# Patient Record
Sex: Male | Born: 1990 | Race: White | Hispanic: No | Marital: Married | Smoking: Never smoker
Health system: Southern US, Community
[De-identification: ages and names within clinical notes are randomized; demographics above are authoritative.]

## PROBLEM LIST (undated history)

## (undated) DIAGNOSIS — N289 Disorder of kidney and ureter, unspecified: Secondary | ICD-10-CM

## (undated) HISTORY — PX: LITHOTRIPSY: SUR834

---

## 2012-06-02 ENCOUNTER — Emergency Department: Payer: Self-pay | Admitting: Emergency Medicine

## 2013-10-03 ENCOUNTER — Emergency Department: Payer: Self-pay | Admitting: Internal Medicine

## 2013-10-03 LAB — CBC
HCT: 41.3 % (ref 40.0–52.0)
HGB: 14.2 g/dL (ref 13.0–18.0)
MCH: 30.1 pg (ref 26.0–34.0)
MCHC: 34.5 g/dL (ref 32.0–36.0)
MCV: 87 fL (ref 80–100)
Platelet: 364 10*3/uL (ref 150–440)
RBC: 4.73 10*6/uL (ref 4.40–5.90)
RDW: 13.2 % (ref 11.5–14.5)
WBC: 11.7 10*3/uL — ABNORMAL HIGH (ref 3.8–10.6)

## 2013-10-03 LAB — COMPREHENSIVE METABOLIC PANEL
ALT: 44 U/L (ref 12–78)
ANION GAP: 5 — AB (ref 7–16)
AST: 34 U/L (ref 15–37)
Albumin: 4.1 g/dL (ref 3.4–5.0)
Alkaline Phosphatase: 57 U/L
BUN: 15 mg/dL (ref 7–18)
Bilirubin,Total: 0.6 mg/dL (ref 0.2–1.0)
CO2: 25 mmol/L (ref 21–32)
Calcium, Total: 9.7 mg/dL (ref 8.5–10.1)
Chloride: 107 mmol/L (ref 98–107)
Creatinine: 1.08 mg/dL (ref 0.60–1.30)
EGFR (African American): >60
EGFR (Non-African Amer.): >60
GLUCOSE: 102 mg/dL — AB (ref 65–99)
Osmolality: 275 (ref 275–301)
Potassium: 3.9 mmol/L (ref 3.5–5.1)
Sodium: 137 mmol/L (ref 136–145)
TOTAL PROTEIN: 8.3 g/dL — AB (ref 6.4–8.2)

## 2013-10-03 LAB — URINALYSIS, COMPLETE
BACTERIA: NONE SEEN
Bilirubin,UR: NEGATIVE
Glucose,UR: NEGATIVE mg/dL (ref 0–75)
Ketone: NEGATIVE
Leukocyte Esterase: NEGATIVE
Nitrite: NEGATIVE
Ph: 5 (ref 4.5–8.0)
RBC,UR: 30 /HPF (ref 0–5)
Specific Gravity: 1.031 (ref 1.003–1.030)
Squamous Epithelial: <1

## 2013-10-25 ENCOUNTER — Ambulatory Visit: Payer: Self-pay | Admitting: Urology

## 2013-11-15 ENCOUNTER — Ambulatory Visit: Payer: Self-pay | Admitting: Urology

## 2013-11-30 ENCOUNTER — Ambulatory Visit: Payer: Self-pay | Admitting: Urology

## 2014-04-10 ENCOUNTER — Ambulatory Visit: Payer: Self-pay | Admitting: Physician Assistant

## 2014-11-04 ENCOUNTER — Ambulatory Visit: Payer: Self-pay | Admitting: Physician Assistant

## 2014-12-18 ENCOUNTER — Ambulatory Visit
Admit: 2014-12-18 | Disposition: A | Discharge status: Home / Self Care | Payer: Self-pay | Attending: Family Medicine | Admitting: Family Medicine

## 2015-07-31 IMAGING — CR DG ABDOMEN 1V
1 series · 2 of 2 positions shown · non-contrast
Comparison: CT STONE STUDY dated 10/03/2013

CLINICAL DATA: recent hx left ureteral stone, no prev surg

EXAM:
ABDOMEN - 1 VIEW

[Series 1: supine kub · 0.17mm/px · 2 of 2 slices shown]
[im 1/2]
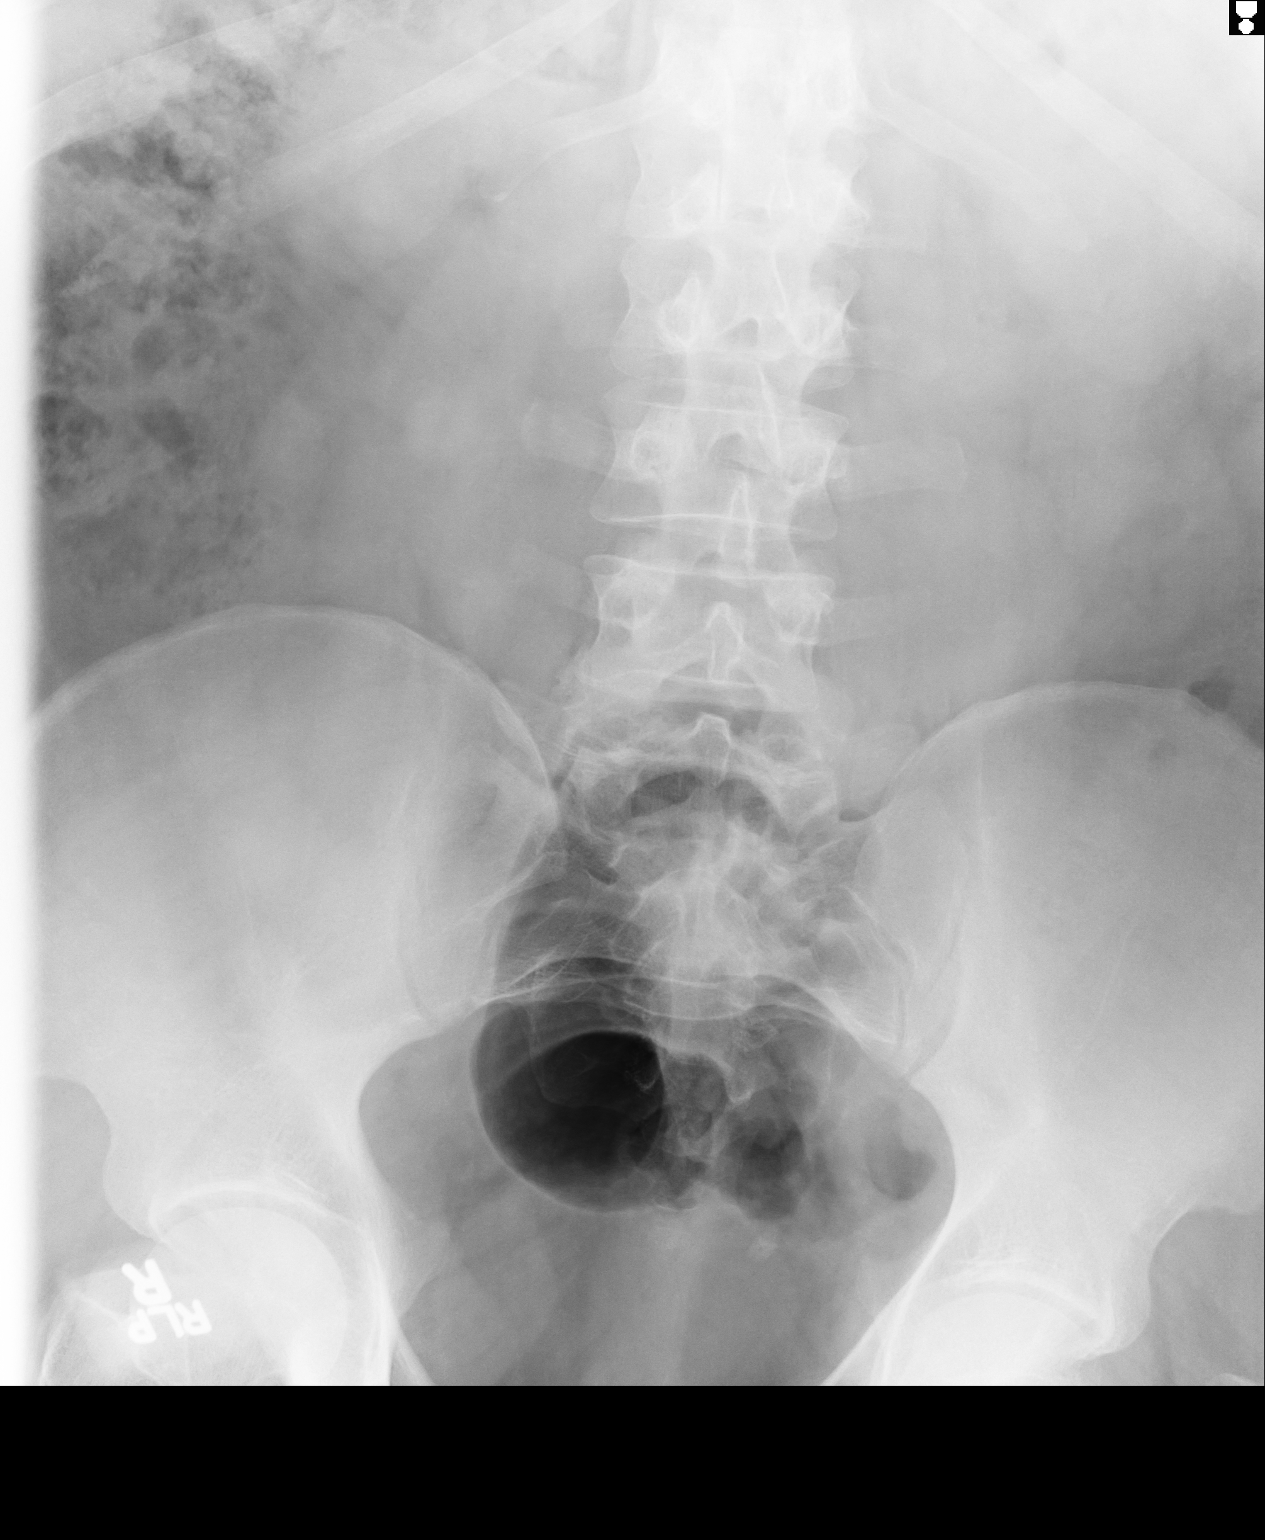
[im 2/2]
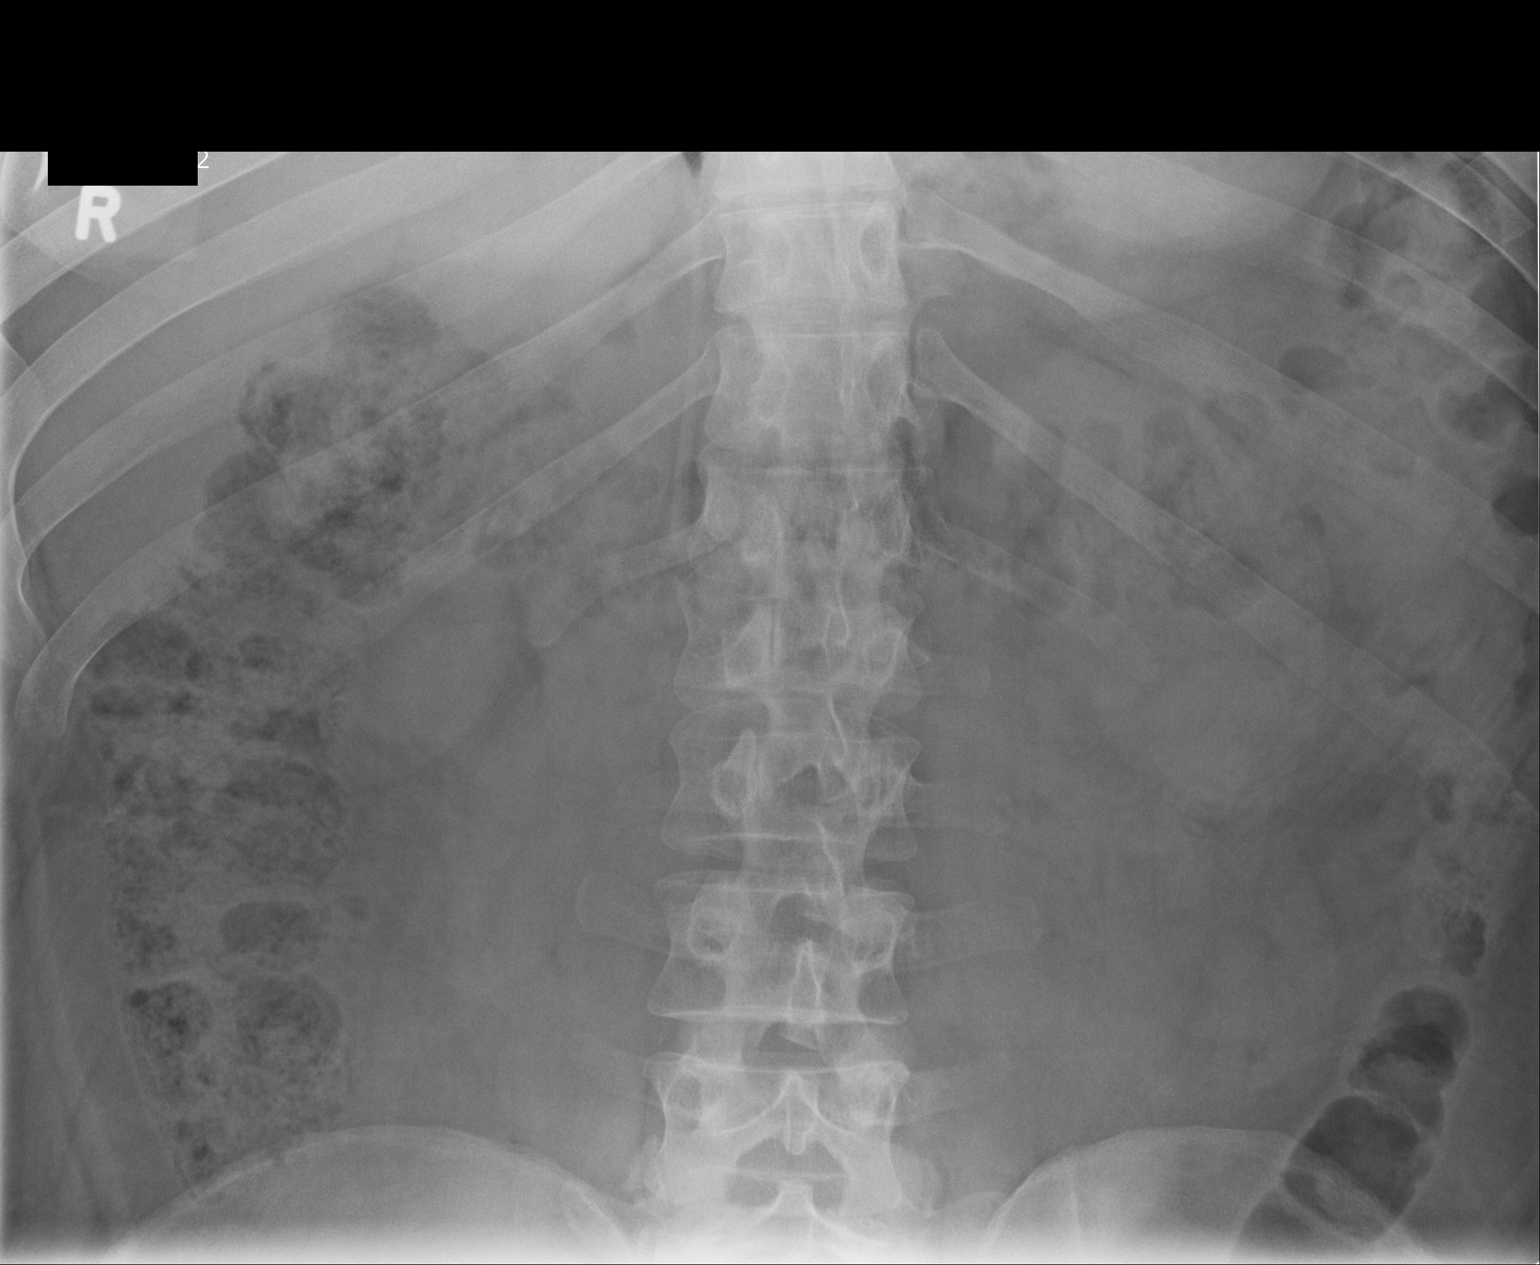

[2 of 2 positions shown; findings below may reference images not displayed]

FINDINGS: The bowel gas pattern is normal. Calcified 7.8 mm density projects
within the central left lateral portion of the pelvis just above the
dome of the urinary bladder. This finding correlates with the
previous CT findings and likely reflects the patient's distal left
ureteral calculus. No further evidence of calculus disease is
appreciated. A moderate to large amount of stool appreciated within
the colon. Osseous structures unremarkable.
IMPRESSION: Findings which appear to reflect the patient's distal left ureteral
calculus. Nonobstructive bowel gas pattern with a moderate to large
amount of stool.

## 2015-08-21 IMAGING — CR DG ABDOMEN 1V
1 series · 2 of 2 positions shown · non-contrast
Comparison: 10/04/2011, 10/25/2013

CLINICAL DATA: Left distal ureteral calculus

EXAM:
ABDOMEN - 1 VIEW

[Series 1: t abdomen supine · 0.14mm/px · 2 of 2 slices shown]
[im 1/2]
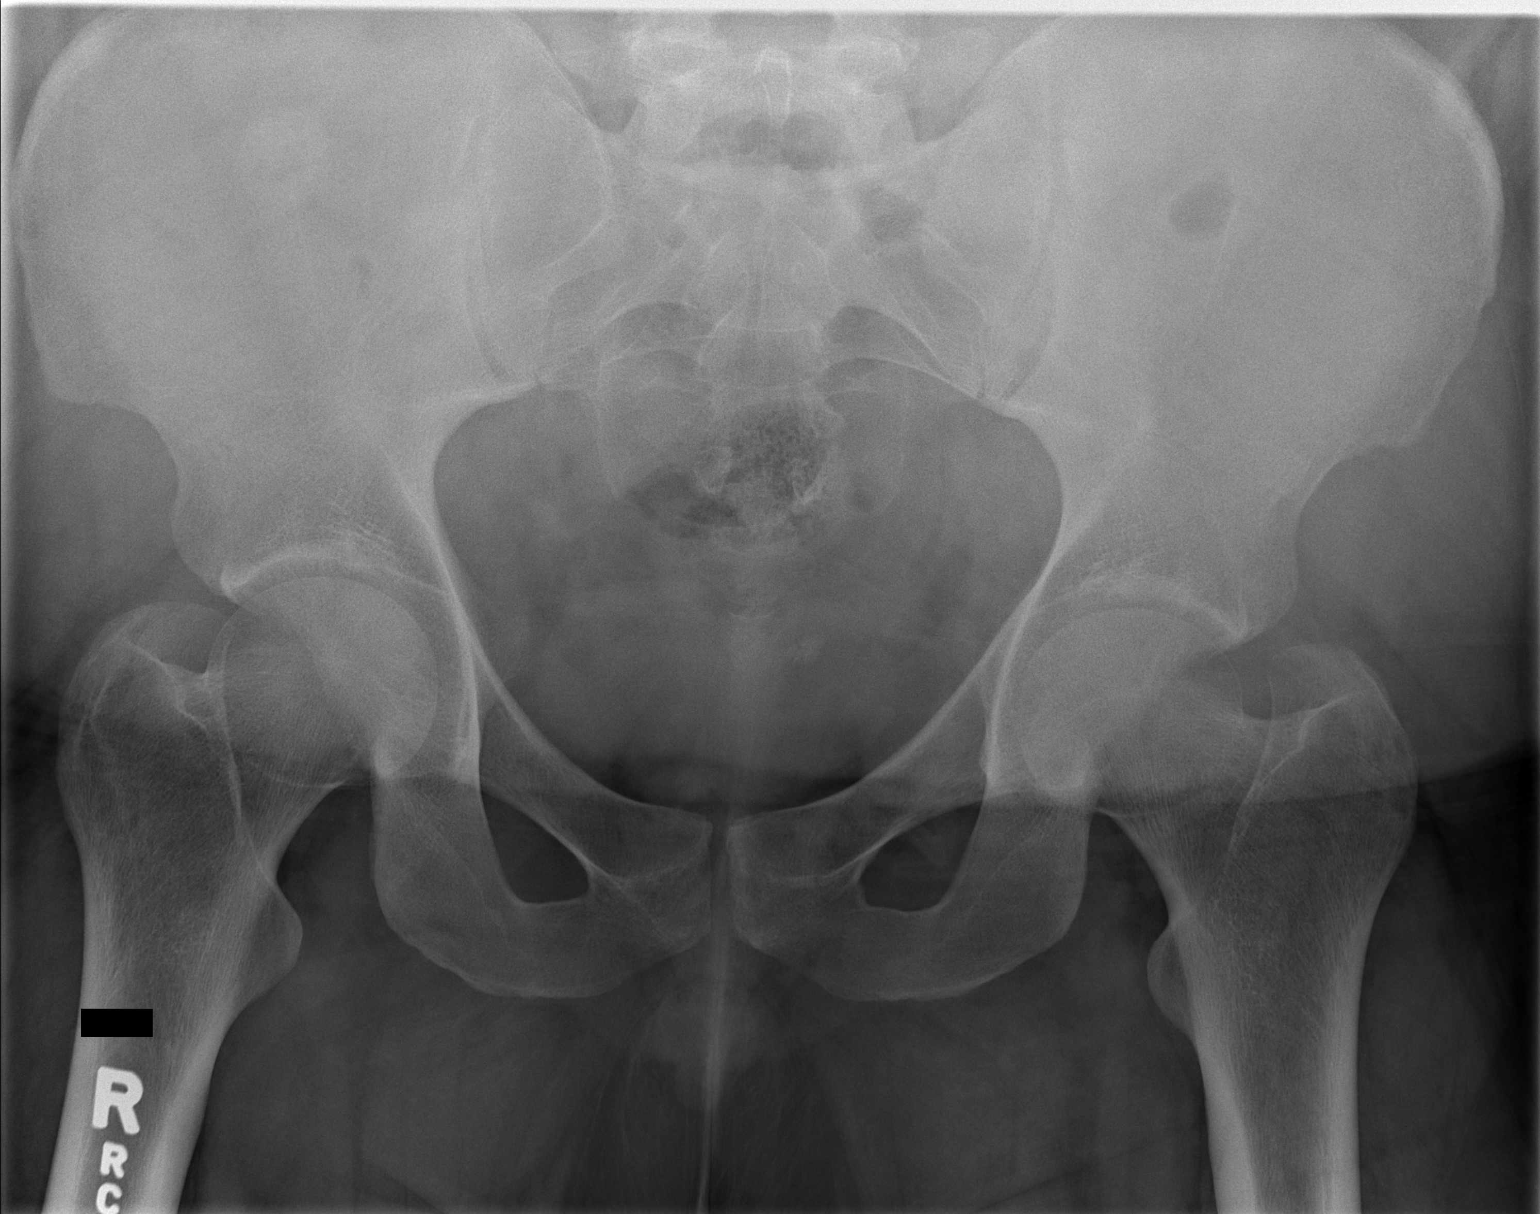
[im 2/2]
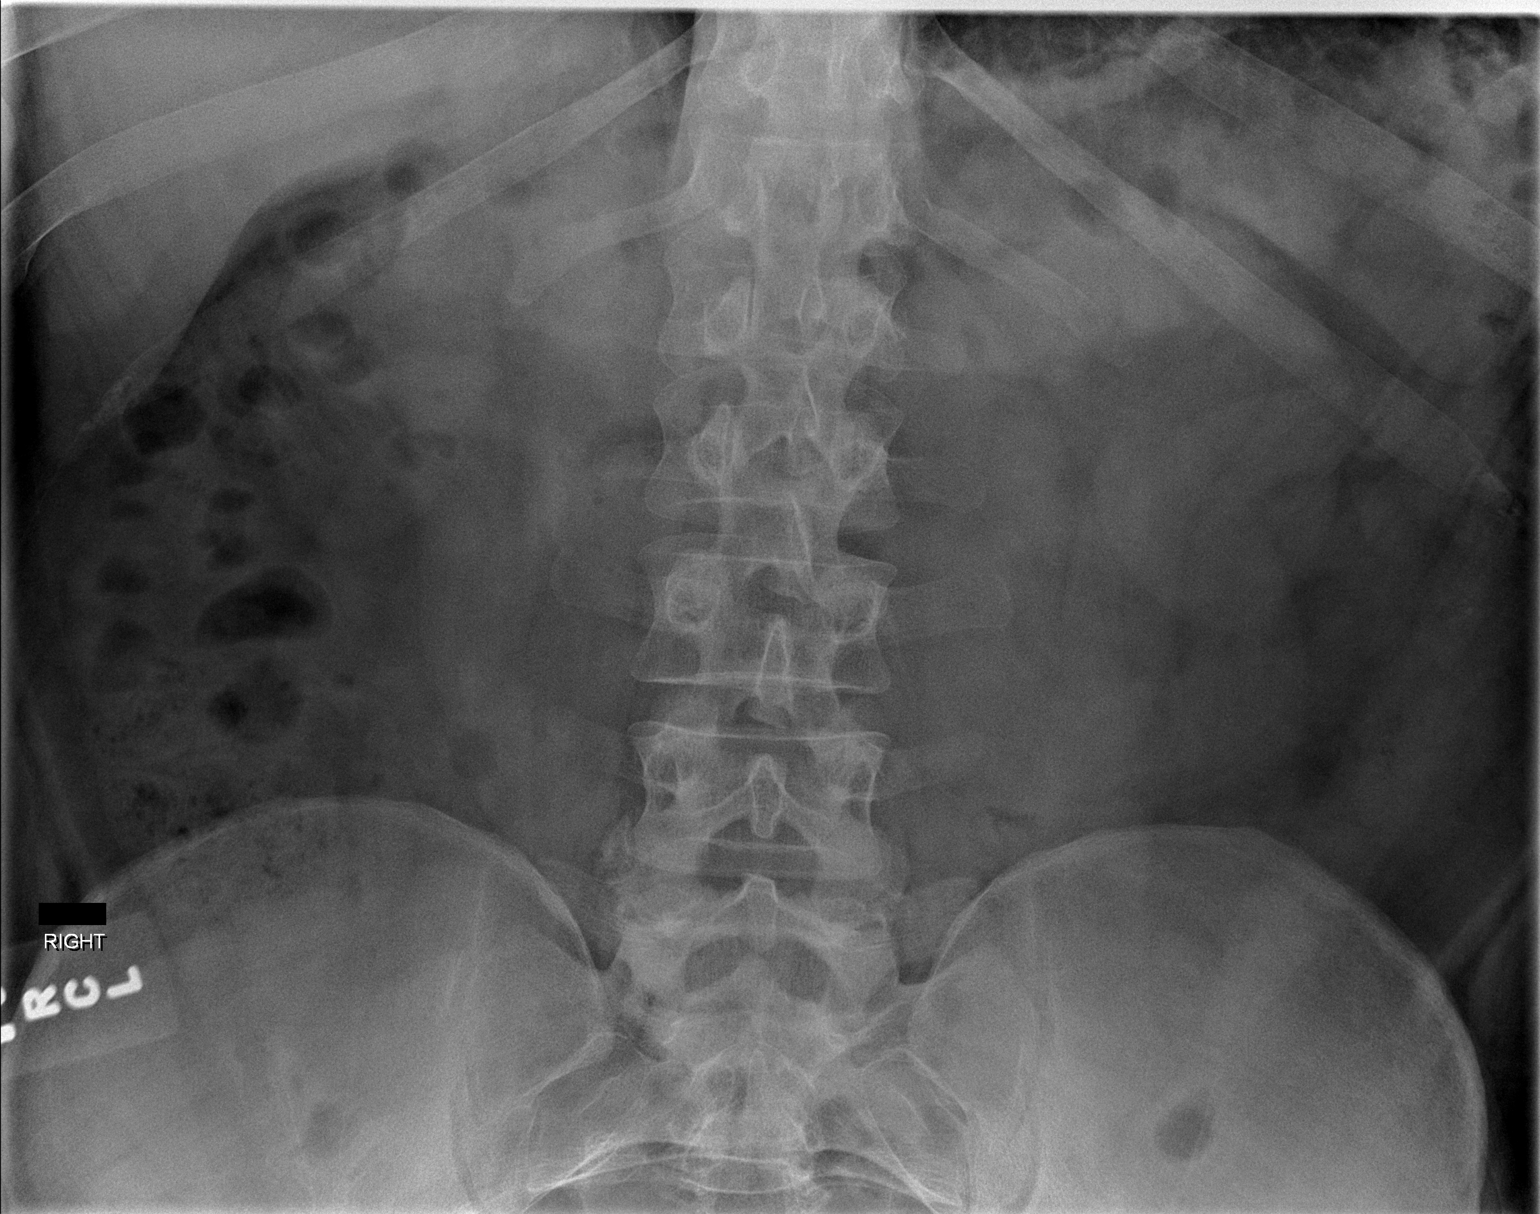

[2 of 2 positions shown; findings below may reference images not displayed]

FINDINGS: 8mm radiopaque left hemipelvis calcification noted which appears to
correlate with a known left distal ureteral calculus by CT
comparison. This calculus is now at the left UVJ.

No other radiopaque calculi demonstrated. Normal bowel-gas pattern.
No acute osseous finding.
IMPRESSION: Persistent 8 mm left UVJ radiopaque calculus.

## 2017-01-18 ENCOUNTER — Ambulatory Visit
Admission: EM | Admit: 2017-01-18 | Discharge: 2017-01-18 | Disposition: A | Discharge status: Home / Self Care | Payer: Self-pay | Attending: Family Medicine | Admitting: Family Medicine

## 2017-01-18 DIAGNOSIS — Z0289 Encounter for other administrative examinations: Secondary | ICD-10-CM

## 2017-01-18 DIAGNOSIS — Z024 Encounter for examination for driving license: Secondary | ICD-10-CM

## 2017-01-18 LAB — DEPT OF TRANSP DIPSTICK, URINE (ARMC ONLY)
Glucose, UA: NEGATIVE mg/dL
Hgb urine dipstick: NEGATIVE
Protein, ur: NEGATIVE mg/dL
Specific Gravity, Urine: 1.01 (ref 1.005–1.030)

## 2017-01-18 NOTE — ED Triage Notes (Signed)
DOT Physical

## 2017-01-18 NOTE — ED Provider Notes (Signed)
CSN: [id number]     Arrival date & time 01/18/17  [id number] History   None    Chief Complaint  Patient presents with  . Commercial Driver's License Exam   (Consider location/radiation/quality/duration/timing/severity/associated sxs/prior Treatment) HPI  This is a 26 year old male who presents for a DOT physical. Patient states that he is not currently taking any prescriptive medications. He has no history of chronic medical problems. His wife states that he does snore. He does deny any daytime sleepiness. He is obese. 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 Surgeries includes ORIF of a finger fracture.       History reviewed. No pertinent past medical history. History reviewed. No pertinent surgical history. Family History  Problem Relation Age of Onset  . Diabetes Mother   . Diabetes Father   . Hypertension Father    Social History  Substance Use Topics  . Smoking status: Never Smoker  . Smokeless tobacco: Current User    Types: Snuff  . Alcohol use Yes    Review of Systems  All other systems reviewed and are negative.   Allergies  Patient has no known allergies.  Home Medications   Prior to Admission medications   Not on File   Meds Ordered and Administered this Visit  Medications - No data to display  BP (!) 142/81 (BP Location: Left Arm)   Pulse 77   Temp 98.3 F (36.8 C) (Oral)   Resp 18   Ht 6\' 4"  (1.93 m)   Wt (!) 368 lb (166.9 kg)   SpO2 100%   BMI 44.79 kg/m  No data found.   Physical Exam  Constitutional:  Referred to DOT physical sheet  Nursing note and vitals reviewed.   Urgent Care Course     Procedures (including critical care time)  Labs Review Labs Reviewed  DEPT OF TRANSP DIPSTICK, URINE([facility] ONLY)    Imaging Review No results found.   Visual Acuity Review  Right Eye Distance:   Left Eye Distance:   Bilateral Distance:    Right Eye Near:   Left Eye  Near:    Bilateral Near:         MDM   1. Encounter for examination required by Department of Transportation (DOT)    Patient qualifies for a DOT 2 year certificate.    Lutricia FeilRoemer, Tomeca Helm P, PA-C 01/18/17 1215

## 2017-08-03 ENCOUNTER — Other Ambulatory Visit
Admission: RE | Admit: 2017-08-03 | Discharge: 2017-08-03 | Disposition: A | Discharge status: Home / Self Care | Payer: Worker's Compensation | Source: Ambulatory Visit | Attending: *Deleted | Admitting: *Deleted

## 2017-08-17 ENCOUNTER — Ambulatory Visit (INDEPENDENT_AMBULATORY_CARE_PROVIDER_SITE_OTHER)
Admission: EM | Admit: 2017-08-17 | Discharge: 2017-08-17 | Disposition: A | Discharge status: Other Acute Inpatient Hospital | Payer: BLUE CROSS/BLUE SHIELD | Source: Home / Self Care | Attending: Emergency Medicine | Admitting: Emergency Medicine

## 2017-08-17 ENCOUNTER — Other Ambulatory Visit: Payer: Self-pay

## 2017-08-17 ENCOUNTER — Encounter: Payer: Self-pay | Admitting: Emergency Medicine

## 2017-08-17 ENCOUNTER — Emergency Department: Payer: BLUE CROSS/BLUE SHIELD

## 2017-08-17 ENCOUNTER — Emergency Department
Admission: EM | Admit: 2017-08-17 | Discharge: 2017-08-17 | Disposition: A | Discharge status: Home / Self Care | Payer: BLUE CROSS/BLUE SHIELD | Attending: Emergency Medicine | Admitting: Emergency Medicine

## 2017-08-17 DIAGNOSIS — Z8249 Family history of ischemic heart disease and other diseases of the circulatory system: Secondary | ICD-10-CM | POA: Insufficient documentation

## 2017-08-17 DIAGNOSIS — R079 Chest pain, unspecified: Secondary | ICD-10-CM | POA: Diagnosis not present

## 2017-08-17 DIAGNOSIS — R05 Cough: Secondary | ICD-10-CM

## 2017-08-17 DIAGNOSIS — Z833 Family history of diabetes mellitus: Secondary | ICD-10-CM | POA: Insufficient documentation

## 2017-08-17 DIAGNOSIS — F17290 Nicotine dependence, other tobacco product, uncomplicated: Secondary | ICD-10-CM | POA: Diagnosis not present

## 2017-08-17 DIAGNOSIS — K219 Gastro-esophageal reflux disease without esophagitis: Secondary | ICD-10-CM | POA: Insufficient documentation

## 2017-08-17 DIAGNOSIS — R0789 Other chest pain: Secondary | ICD-10-CM

## 2017-08-17 DIAGNOSIS — N289 Disorder of kidney and ureter, unspecified: Secondary | ICD-10-CM | POA: Insufficient documentation

## 2017-08-17 DIAGNOSIS — E669 Obesity, unspecified: Secondary | ICD-10-CM | POA: Insufficient documentation

## 2017-08-17 HISTORY — DX: Disorder of kidney and ureter, unspecified: N28.9

## 2017-08-17 LAB — BASIC METABOLIC PANEL
Anion gap: 6 (ref 5–15)
BUN: 12 mg/dL (ref 6–20)
CALCIUM: 9.2 mg/dL (ref 8.9–10.3)
CO2: 26 mmol/L (ref 22–32)
CREATININE: 0.89 mg/dL (ref 0.61–1.24)
Chloride: 103 mmol/L (ref 101–111)
Glucose, Bld: 142 mg/dL — ABNORMAL HIGH (ref 65–99)
Potassium: 3.6 mmol/L (ref 3.5–5.1)
SODIUM: 135 mmol/L (ref 135–145)

## 2017-08-17 LAB — CBC
HCT: 42.5 % (ref 40.0–52.0)
Hemoglobin: 14.8 g/dL (ref 13.0–18.0)
MCH: 30.2 pg (ref 26.0–34.0)
MCHC: 34.7 g/dL (ref 32.0–36.0)
MCV: 87 fL (ref 80.0–100.0)
PLATELETS: 376 10*3/uL (ref 150–440)
RBC: 4.89 MIL/uL (ref 4.40–5.90)
RDW: 13 % (ref 11.5–14.5)
WBC: 9.1 10*3/uL (ref 3.8–10.6)

## 2017-08-17 LAB — TROPONIN I

## 2017-08-17 MED ORDER — GI COCKTAIL ~~LOC~~
30.0000 mL | Freq: Once | ORAL | Status: AC
  Administered 2017-08-17: 30 mL via ORAL
  Filled 2017-08-17: qty 30

## 2017-08-17 MED ORDER — FAMOTIDINE 20 MG PO TABS: 20.0000 mg | ORAL_TABLET | Freq: Two times a day (BID) | ORAL | 1 refills | Status: AC

## 2017-08-17 MED ORDER — SUCRALFATE 1 G PO TABS: 1.0000 g | ORAL_TABLET | Freq: Four times a day (QID) | ORAL | 1 refills | Status: AC

## 2017-08-17 MED ORDER — FAMOTIDINE 20 MG PO TABS
20.0000 mg | ORAL_TABLET | Freq: Once | ORAL | Status: AC
  Administered 2017-08-17: 20 mg via ORAL
  Filled 2017-08-17: qty 1

## 2017-08-17 MED ORDER — ASPIRIN 81 MG PO CHEW
324.0000 mg | CHEWABLE_TABLET | Freq: Once | ORAL | Status: AC
Start: 2017-08-17 — End: 2017-08-17
  Administered 2017-08-17: 324 mg via ORAL

## 2017-08-17 NOTE — ED Notes (Signed)
Pt [facility]. Pt is A/O x4 and is NAD. EDP aware.

## 2017-08-17 NOTE — ED Triage Notes (Signed)
Patient states that he woke up around 1:30 am this morning with heartburn and pain in his chest.  Patient denies SOB.

## 2017-08-17 NOTE — Discharge Instructions (Signed)
Let them know immediately if your chest pain returns.

## 2017-08-17 NOTE — ED Provider Notes (Signed)
HPI  SUBJECTIVE:  Brett Brooks is a 26 y.o. male who presents with substernal chest pain described as pressure waking him up last night at 0 130.  It lasted for approximately 2 hours.  He states that it was accompanied with the "worst heartburn that I have ever had" with water brash, belching and coughing.  He states that the symptoms were slightly better with belching, worse with lying down his left side.  He did not try anything for this.  No radiation to his arm, back, neck.  No diaphoresis, nausea.  No palpitations, presyncope, syncope.  No exertional component.  He states that the heartburn felt like it was separate from the chest pain.  No wheezing, shortness of breath, abdominal or back pain.  He has never had symptoms like this before.  He has a past medical history of occasional GERD, obesity.  He dips tobacco.  No history of diabetes, hypertension, hypercholesterolemia, HIV, coronary disease, MI.  Family history significant for grandmother with heart attack in her 6450s.  PMD: None.  He has never had a cardiac evaluation.    Past Medical History:  Diagnosis Date  . Renal disorder     History reviewed. No pertinent surgical history.  Family History  Problem Relation Age of Onset  . Diabetes Mother   . Diabetes Father   . Hypertension Father     Social History   Tobacco Use  . Smoking status: Never Smoker  . Smokeless tobacco: Current User    Types: Snuff  Substance Use Topics  . Alcohol use: Yes  . Drug use: No    No current facility-administered medications for this encounter.  No current outpatient medications on file.  No Known Allergies   ROS  As noted in HPI.   Physical Exam  BP (!) 163/81 (BP Location: Right Arm)   Pulse 86   Temp 98.6 F (37 C) (Oral)   Resp 16   Ht 6\' 4"  (1.93 m)   Wt (!) 360 lb (163.3 kg)   SpO2 99%   BMI 43.82 kg/m   Constitutional: Well developed, well nourished, no acute distress Eyes: PERRL, EOMI, conjunctiva normal  bilaterally HENT: Normocephalic, atraumatic,mucus membranes moist Respiratory: Clear to auscultation bilaterally, no rales, no wheezing, no rhonchi Cardiovascular: Normal rate and rhythm, no murmurs, no gallops, no rubs GI: Soft, nondistended, normal bowel sounds, nontender, no rebound, no guarding skin: No rash, skin intact Musculoskeletal: Calves symmetric, nontender no edema, no tenderness, no deformities Neurologic: Alert & oriented x 3, CN II-XII grossly intact, no motor deficits, sensation grossly intact Psychiatric: Speech and behavior appropriate   ED Course   Medications  aspirin chewable tablet 324 mg (324 mg Oral Given 08/17/17 1044)    Orders Placed This Encounter  Procedures  . ED EKG    Standing Status:   Standing    Number of Occurrences:   1    Order Specific Question:   Reason for Exam    Answer:   Chest Pain  . EKG 12-Lead    Standing Status:   Standing    Number of Occurrences:   1   No results found for this or any previous visit (from the past 24 hour(s)). No results found.  ED Clinical Impression  Chest pain, unspecified type   ED Assessment/Plan   Normal sinus rhythm, rate 89.  Normal axis.  Normal intervals.  No hypertrophy.  no ST elevation. No previous EKG for comparison.  Patient's description of the pain, his  risk factors of obesity and family history and the fact that he has never had a cardiac workup combined with a not completely normal EKG, transferring to the ED for a comprehensive evaluation.  Giving 324 mg of aspirin here.  He is symptom-free at this point in time so I think that he is safe to go by private vehicle.  [facility].  Notified Judeth CornfieldStephanie, Press photographercharge nurse.  Meds ordered this encounter  Medications  . aspirin chewable tablet 324 mg    *This clinic note was created using Scientist, clinical (histocompatibility and immunogenetics)Dragon dictation software. Therefore, there may be occasional mistakes despite careful proofreading.  ?   Brett Brooks, Brett Korenek,  MD 08/17/17 1113

## 2017-08-17 NOTE — ED Triage Notes (Signed)
Pt sent [facility] with c/o substernal chest pain . Denies SOB or other sx. Pt is in NAD on arrival.

## 2017-08-17 NOTE — ED Notes (Signed)
First Nurse Note:  Patient here from [facility] with chest pain.  Mother states that [facility] "that [facility]'t quite look right".  Patient states he awoke this AM with "heartburn".  Child alert and oriented.

## 2017-08-17 NOTE — ED Provider Notes (Signed)
[facility] Emergency Department Provider Note       Time seen: ----------------------------------------- 1:09 PM on 08/17/2017 -----------------------------------------   I have reviewed the triage vital signs and the nursing notes.  HISTORY   Chief Complaint Chest Pain    HPI Brett Brooks is a 26 y.o. male with a history of renal disorder who presents to the ED for [place] [facility] who complains of substernal chest pain.  Patient complains of substernal chest pain.  He initially stated the symptoms started with a burning in his chest that radiated into his abdomen coughing episodes and then chest pain after that.  He has not had this happen before but does have indigestion from time to time he states.  He does not take any antacids.  Past Medical History:  Diagnosis Date  . Renal disorder     There are no active problems to display for this patient.   Past Surgical History:  Procedure Laterality Date  . LITHOTRIPSY      Allergies Patient has no known allergies.  Social History Social History   Tobacco Use  . Smoking status: Never Smoker  . Smokeless tobacco: Current User    Types: Snuff  Substance Use Topics  . Alcohol use: Yes  . Drug use: No    Review of Systems Constitutional: Negative for fever. Eyes: Negative for vision changes ENT:  Negative for congestion, sore throat Cardiovascular: Positive for chest pain Respiratory: Positive for cough Gastrointestinal: Negative for abdominal pain, vomiting and diarrhea. Musculoskeletal: Negative for back pain. Skin: Negative for rash. Neurological: Negative for headaches, focal weakness or numbness.  All systems negative/normal/unremarkable except as stated in the HPI  ____________________________________________   PHYSICAL EXAM:  VITAL SIGNS: ED Triage Vitals  Enc Vitals Group     BP 08/17/17 1116 (!) 161/88     Pulse Rate 08/17/17 1116 84     Resp 08/17/17 1116 18      Temp 08/17/17 1116 99.1 F (37.3 C)     Temp Source 08/17/17 1116 Oral     SpO2 08/17/17 1116 99 %     Weight 08/17/17 1122 (!) 360 lb (163.3 kg)     Height 08/17/17 1122 6\' 4"  (1.93 m)     Head Circumference --      Peak Flow --      Pain Score 08/17/17 1116 1     Pain Loc --      Pain Edu? --      Excl. in GC? --     Constitutional: Alert and oriented. Well appearing and in no distress. Eyes: Conjunctivae are normal. Normal extraocular movements. ENT   Head: Normocephalic and atraumatic.   Nose: No congestion/rhinnorhea.   Mouth/Throat: Mucous membranes are moist.   Neck: No stridor. Cardiovascular: Normal rate, regular rhythm. No murmurs, rubs, or gallops. Respiratory: Normal respiratory effort without tachypnea nor retractions. Breath sounds are clear and equal bilaterally. No wheezes/rales/rhonchi. Gastrointestinal: Soft and nontender. Normal bowel sounds Musculoskeletal: Nontender with normal range of motion in extremities. No lower extremity tenderness nor edema. Neurologic:  Normal speech and language. No gross focal neurologic deficits are appreciated.  Skin:  Skin is warm, dry and intact. No rash noted. Psychiatric: Mood and affect are normal. Speech and behavior are normal.  ____________________________________________  EKG: Interpreted by me.  Sinus rhythm rate 76 bpm, normal PR interval, normal QRS, normal QT.  ____________________________________________  ED COURSE:  As part of my medical decision making, I reviewed the following data  within the electronic MEDICAL RECORD NUMBER History obtained from family if available, nursing notes, old chart and ekg, as well as notes from prior ED visits. Patient presented for chest pain, we will assess with labs and imaging as indicated at this time.   Procedures ____________________________________________   LABS (pertinent positives/negatives)  Labs Reviewed  BASIC METABOLIC PANEL - Abnormal; Notable for  the following components:      Result Value   Glucose, Bld 142 (*)    All other components within normal limits  CBC  TROPONIN I    RADIOLOGY Images were viewed by me  Chest x-ray is normal  ____________________________________________  DIFFERENTIAL DIAGNOSIS   Musculoskeletal pain, GERD, unstable angina, PE, MI  FINAL ASSESSMENT AND PLAN  GERD   Plan: Patient had presented for chest pain which resembled reflux. Patient's labs are reassuring. Patient's imaging was also reassuring.  He was given a GI cocktail as well as Pepcid.  He will be discharged with Pepcid and Carafate.  He is stable for outpatient follow-up.   Emily FilbertWilliams, Gailya Tauer E, MD   Note: This note was generated in part or whole with voice recognition software. Voice recognition is usually quite accurate but there are transcription errors that can and very often do occur. I apologize for any typographical errors that were not detected and corrected.     Emily FilbertWilliams, Rosamund Nyland E, MD 08/17/17 1311

## 2017-08-17 NOTE — ED Notes (Signed)
Patient to Room 9, Cassie RN aware.

## 2019-07-03 ENCOUNTER — Other Ambulatory Visit: Payer: Self-pay

## 2019-07-03 DIAGNOSIS — Z20822 Contact with and (suspected) exposure to covid-19: Secondary | ICD-10-CM

## 2019-07-05 LAB — NOVEL CORONAVIRUS, NAA: SARS-CoV-2, NAA: NOT DETECTED

## 2019-07-30 ENCOUNTER — Other Ambulatory Visit: Payer: Self-pay

## 2019-07-30 DIAGNOSIS — Z20822 Contact with and (suspected) exposure to covid-19: Secondary | ICD-10-CM

## 2019-07-31 LAB — NOVEL CORONAVIRUS, NAA: SARS-CoV-2, NAA: NOT DETECTED

## 2020-08-17 ENCOUNTER — Other Ambulatory Visit: Payer: Self-pay

## 2020-08-17 ENCOUNTER — Encounter: Payer: Self-pay | Admitting: Emergency Medicine

## 2020-08-17 ENCOUNTER — Ambulatory Visit
Admission: EM | Admit: 2020-08-17 | Discharge: 2020-08-17 | Disposition: A | Discharge status: Home / Self Care | Payer: BC Managed Care – PPO | Attending: Physician Assistant | Admitting: Physician Assistant

## 2020-08-17 DIAGNOSIS — Z20822 Contact with and (suspected) exposure to covid-19: Secondary | ICD-10-CM | POA: Diagnosis not present

## 2020-08-17 DIAGNOSIS — J069 Acute upper respiratory infection, unspecified: Secondary | ICD-10-CM

## 2020-08-17 LAB — RESP PANEL BY RT-PCR (FLU A&B, COVID) ARPGX2
Influenza A by PCR: NEGATIVE
Influenza B by PCR: NEGATIVE
SARS Coronavirus 2 by RT PCR: NEGATIVE

## 2020-08-17 NOTE — Discharge Instructions (Addendum)

## 2020-08-17 NOTE — ED Provider Notes (Signed)
[facility]    CSN: [id number] Arrival date & time: 08/17/20  1026      History   Chief Complaint Chief Complaint  Patient presents with  . Cough  . Sinus Problem    HPI Brett Brooks is a 29 y.o. male presenting for 4-day history of cough, nasal congestion, sinus pressure and feeling feverish.  Patient states that his son tested positive for COVID-19 4 days ago.   Patient has been fully vaccinated for COVID-19.  Patient denies any associated headaches, weakness, sore throat, body difficulty, nausea/vomiting/diarrhea, or change in smell or taste.  Has not taken any over-the-counter medication for symptoms.  No complaints or concerns at this time.  HPI  Past Medical History:  Diagnosis Date  . Renal disorder     There are no problems to display for this patient.   Past Surgical History:  Procedure Laterality Date  . LITHOTRIPSY         Home Medications    Prior to Admission medications   Medication Sig Start Date End Date Taking? Authorizing Provider  famotidine (PEPCID) 20 MG tablet Take 1 tablet (20 mg total) by mouth 2 (two) times daily. 08/17/17   Emily Filbert, MD  sucralfate (CARAFATE) 1 g tablet Take 1 tablet (1 g total) by mouth 4 (four) times daily. 08/17/17 08/17/18  Emily Filbert, MD    Family History Family History  Problem Relation Age of Onset  . Diabetes Mother   . Diabetes Father   . Hypertension Father     Social History Social History   Tobacco Use  . Smoking status: Never Smoker  . Smokeless tobacco: Current User    Types: Snuff  Vaping Use  . Vaping Use: Never used  Substance Use Topics  . Alcohol use: Yes  . Drug use: No     Allergies   Patient has no known allergies.   Review of Systems Review of Systems  Constitutional: Negative for fatigue and fever.  HENT: Positive for congestion, rhinorrhea and sinus pressure. Negative for sinus pain and sore throat.   Respiratory: Positive for cough.  Negative for shortness of breath.   Gastrointestinal: Negative for abdominal pain, diarrhea, nausea and vomiting.  Musculoskeletal: Negative for myalgias.  Neurological: Negative for weakness, light-headedness and headaches.  Hematological: Negative for adenopathy.     Physical Exam Triage Vital Signs ED Triage Vitals  Enc Vitals Group     BP 08/17/20 1224 (!) 160/103     Pulse Rate 08/17/20 1224 95     Resp 08/17/20 1224 16     Temp 08/17/20 1224 98.7 F (37.1 C)     Temp Source 08/17/20 1224 Oral     SpO2 08/17/20 1224 98 %     Weight 08/17/20 1222 (!) 390 lb (176.9 kg)     Height 08/17/20 1222 6\' 4"  (1.93 m)     Head Circumference --      Peak Flow --      Pain Score 08/17/20 1221 0     Pain Loc --      Pain Edu? --      Excl. in GC? --    No data found.  Updated Vital Signs BP (!) 160/103 (BP Location: Left Arm)   Pulse 95   Temp 98.7 F (37.1 C) (Oral)   Resp 16   Ht 6\' 4"  (1.93 m)   Wt (!) 390 lb (176.9 kg)   SpO2 98%   BMI 47.47 kg/m  Physical Exam Vitals and nursing note reviewed.  Constitutional:      General: He is not in acute distress.    Appearance: Normal appearance. He is well-developed and well-nourished. He is not ill-appearing or diaphoretic.  HENT:     Head: Normocephalic and atraumatic.     Nose: Congestion and rhinorrhea present.     Mouth/Throat:     Mouth: Oropharynx is clear and moist and mucous membranes are normal.     Pharynx: Uvula midline. No oropharyngeal exudate.     Tonsils: No tonsillar abscesses.  Eyes:     General: No scleral icterus.       Right eye: No discharge.        Left eye: No discharge.     Extraocular Movements: EOM normal.     Conjunctiva/sclera: Conjunctivae normal.  Neck:     Thyroid: No thyromegaly.     Trachea: No tracheal deviation.  Cardiovascular:     Rate and Rhythm: Normal rate and regular rhythm.     Heart sounds: Normal heart sounds.  Pulmonary:     Effort: Pulmonary effort is normal. No  respiratory distress.     Breath sounds: Normal breath sounds. No wheezing or rales.  Musculoskeletal:     Cervical back: Normal range of motion and neck supple.  Lymphadenopathy:     Cervical: No cervical adenopathy.  Skin:    General: Skin is warm and dry.     Findings: No rash.  Neurological:     General: No focal deficit present.     Mental Status: He is alert. Mental status is at baseline.     Motor: No weakness.     Gait: Gait normal.  Psychiatric:        Mood and Affect: Mood normal.        Behavior: Behavior normal.        Thought Content: Thought content normal.      UC Treatments / Results  Labs (all labs ordered are listed, but only abnormal results are displayed) Labs Reviewed  RESP PANEL BY RT-PCR (FLU A&B, COVID) [id number]    EKG   Radiology No results found.  Procedures Procedures (including critical care time)  Medications Ordered in UC Medications - No data to display  Initial Impression / Assessment and Plan / UC Course  I have reviewed the triage vital signs and the nursing notes.  Pertinent labs & imaging results that were available during my care of the patient were reviewed by me and considered in my medical decision making (see chart for details).   Respiratory panel obtained which is negative.  Advised patient likely has a viral illness.  Should still quarantine given + COVID exposure. Advised supportive care. Suggested over-the-counter decongestants and nasal sprays.  Advised initial clinical about 7-10 days.  No concern for bacterial sinusitis.  Return and ED precautions discussed with patient.   Final Clinical Impressions(s) / UC Diagnoses   Final diagnoses:  Viral URI with cough  Exposure to COVID-19 virus     Discharge Instructions     You have received COVID testing today either for positive exposure, concerning symptoms that could be related to COVID infection, screening purposes, or re-testing after confirmed positive.  Your  test obtained today checks for active viral infection in the last 1-2 weeks. If your test is negative now, you can still test positive later. So, if you do develop symptoms you should either get re-tested and/or isolate x 10 days. Please follow CDC guidelines.  While Rapid antigen tests come back in 15-20 minutes, send out PCR/molecular test results typically come back within 24 hours. In the mean time, if you are symptomatic, assume this could be a positive test and treat/monitor yourself as if you do have COVID.   We will call with test results. Please download the MyChart app and set up a profile to access test results.   If symptomatic, go home and rest. Push fluids. Take Tylenol as needed for discomfort. Gargle warm salt water. Throat lozenges. Take Mucinex DM or Robitussin for cough. Humidifier in bedroom to ease coughing. Warm showers. Also review the COVID handout for more information.  COVID-19 INFECTION: The incubation period of COVID-19 is approximately 14 days after exposure, with most symptoms developing in roughly 4-5 days. Symptoms may range in severity from mild to critically severe. Roughly 80% of those infected will have mild symptoms. People of any age may become infected with COVID-19 and have the ability to transmit the virus. The most common symptoms include: fever, fatigue, cough, body aches, headaches, sore throat, nasal congestion, shortness of breath, nausea, vomiting, diarrhea, changes in smell and/or taste.    COURSE OF ILLNESS Some patients may begin with mild disease which can progress quickly into critical symptoms. If your symptoms are worsening please call ahead to the Emergency Department and proceed there for further treatment. Recovery time appears to be roughly 1-2 weeks for mild symptoms and 3-6 weeks for severe disease.   GO IMMEDIATELY TO ER FOR FEVER YOU ARE UNABLE TO GET DOWN WITH TYLENOL, BREATHING PROBLEMS, CHEST PAIN, FATIGUE, LETHARGY, INABILITY TO EAT OR  DRINK, ETC  QUARANTINE AND ISOLATION: To help decrease the spread of COVID-19 please remain isolated if you have COVID infection or are highly suspected to have COVID infection. This means -stay home and isolate to one room in the home if you live with others. Do not share a bed or bathroom with others while ill, sanitize and wipe down all countertops and keep common areas clean and disinfected. You may discontinue isolation if you have a mild case and are asymptomatic 10 days after symptom onset as long as you have been fever free >24 hours without having to take Motrin or Tylenol. If your case is more severe (meaning you develop pneumonia or are admitted in the hospital), you may have to isolate longer.   If you have been in close contact (within 6 feet) of someone diagnosed with COVID 19, you are advised to quarantine in your home for 14 days as symptoms can develop anywhere from 2-14 days after exposure to the virus. If you develop symptoms, you  must isolate.  Most current guidelines for COVID after exposure -isolate 10 days if you ARE NOT tested for COVID as long as symptoms do not develop -isolate 7 days if you are tested and remain asymptomatic -You do not necessarily need to be tested for COVID if you have + exposure and        develop   symptoms. Just isolate at home x10 days from symptom onset During this global pandemic, CDC advises to practice social distancing, try to stay at least 896ft away from others at all times. Wear a face covering. Wash and sanitize your hands regularly and avoid going anywhere that is not necessary.  KEEP IN MIND THAT THE COVID TEST IS NOT 100% ACCURATE AND YOU SHOULD STILL DO EVERYTHING TO PREVENT POTENTIAL SPREAD OF VIRUS TO OTHERS (WEAR MASK, WEAR GLOVES, WASH HANDS AND SANITIZE REGULARLY).  IF INITIAL TEST IS NEGATIVE, THIS MAY NOT MEAN YOU ARE DEFINITELY NEGATIVE. MOST ACCURATE TESTING IS DONE 5-7 DAYS AFTER EXPOSURE.   It is not advised by CDC to get re-tested  after receiving a positive COVID test since you can still test positive for weeks to months after you have already cleared the virus.   *If you have not been vaccinated for COVID, I strongly suggest you consider getting vaccinated as long as there are no contraindications.      ED Prescriptions    None     PDMP not reviewed this encounter.   Shirlee Latch, PA-C 08/18/20 2012

## 2020-08-17 NOTE — ED Triage Notes (Signed)
Patient c/o cough, sinus congestion and pressure and fevers that started Wed.  Patient states that his son tested positive for covid on Wed.

## 2020-08-18 ENCOUNTER — Ambulatory Visit
Admission: EM | Admit: 2020-08-18 | Discharge: 2020-08-18 | Disposition: A | Discharge status: Home / Self Care | Payer: BC Managed Care – PPO

## 2020-08-22 ENCOUNTER — Telehealth: Payer: BC Managed Care – PPO | Admitting: Physician Assistant

## 2020-08-22 DIAGNOSIS — J069 Acute upper respiratory infection, unspecified: Secondary | ICD-10-CM | POA: Diagnosis not present

## 2020-08-22 MED ORDER — FLUTICASONE PROPIONATE 50 MCG/ACT NA SUSP: 2.0000 | Freq: Every day | NASAL | 0 refills | Status: AC

## 2020-08-22 NOTE — Progress Notes (Signed)
We are sorry you are not feeling well.  Here is how we plan to help!  It looks like you are doing all the right things.  The z-pack should have taken care of the bacteria.  It sounds like you have some residual congestion/ear fullness we nee to take care of.  The best way to do that is with Flonase.  It will open your nasal passages and your eustachian tube to allow your ear to drain.  I would expect a somewhat sore throat for several more days as you continue to have drainage.   Based on what you have shared with me, it looks like you may have a viral upper respiratory infection.  Upper respiratory infections are caused by a large number of viruses; however, rhinovirus is the most common cause.   Symptoms vary from person to person, with common symptoms including sore throat, cough, fatigue or lack of energy and feeling of general discomfort.  A low-grade fever of up to 100.4 may present, but is often uncommon.  Symptoms vary however, and are closely related to a person's age or underlying illnesses.  The most common symptoms associated with an upper respiratory infection are nasal discharge or congestion, cough, sneezing, headache and pressure in the ears and face.  These symptoms usually persist for about 3 to 10 days, but can last up to 2 weeks.  It is important to know that upper respiratory infections do not cause serious illness or complications in most cases.    Upper respiratory infections can be transmitted from person to person, with the most common method of transmission being a person's hands.  The virus is able to live on the skin and can infect other persons for up to 2 hours after direct contact.  Also, these can be transmitted when someone coughs or sneezes; thus, it is important to cover the mouth to reduce this risk.  To keep the spread of the illness at bay, good hand hygiene is very important.  This is an infection that is most likely caused by a virus. There are no specific treatments  other than to help you with the symptoms until the infection runs its course.  We are sorry you are not feeling well.  Here is how we plan to help!   For nasal congestion, you may use an oral decongestants such as Mucinex D or if you have glaucoma or high blood pressure use plain Mucinex.  Saline nasal spray or nasal drops can help and can safely be used as often as needed for congestion.  For your congestion, I have prescribed Fluticasone nasal spray one spray in each nostril twice a day  If you do not have a history of heart disease, hypertension, diabetes or thyroid disease, prostate/bladder issues or glaucoma, you may also use Sudafed to treat nasal congestion.  It is highly recommended that you consult with a pharmacist or your primary care physician to ensure this medication is safe for you to take.     If you have a cough, you may use cough suppressants such as Delsym and Robitussin.  If you have glaucoma or high blood pressure, you can also use Coricidin HBP.    If you have a sore or scratchy throat, use a saltwater gargle-  to  teaspoon of salt dissolved in a 4-ounce to 8-ounce glass of warm water.  Gargle the solution for approximately 15-30 seconds and then spit.  It is important not to swallow the solution.  You can  also use throat lozenges/cough drops and Chloraseptic spray to help with throat pain or discomfort.  Warm or cold liquids can also be helpful in relieving throat pain.  For headache, pain or general discomfort, you can use Ibuprofen or Tylenol as directed.   Some authorities believe that zinc sprays or the use of Echinacea may shorten the course of your symptoms.   HOME CARE . Only take medications as instructed by your medical team. . Be sure to drink plenty of fluids. Water is fine as well as fruit juices, sodas and electrolyte beverages. You may want to stay away from caffeine or alcohol. If you are nauseated, try taking small sips of liquids. How do you know if you are  getting enough fluid? Your urine should be a pale yellow or almost colorless. . Get rest. . Taking a steamy shower or using a humidifier may help nasal congestion and ease sore throat pain. You can place a towel over your head and breathe in the steam from hot water coming from a faucet. . Using a saline nasal spray works much the same way. . Cough drops, hard candies and sore throat lozenges may ease your cough. . Avoid close contacts especially the very young and the elderly . Cover your mouth if you cough or sneeze . Always remember to wash your hands.   GET HELP RIGHT AWAY IF: . You develop worsening fever. . If your symptoms do not improve within 10 days . You develop yellow or green discharge from your nose over 3 days. . You have coughing fits . You develop a severe head ache or visual changes. . You develop shortness of breath, difficulty breathing or start having chest pain . Your symptoms persist after you have completed your treatment plan  MAKE SURE YOU   Understand these instructions.  Will watch your condition.  Will get help right away if you are not doing well or get worse.  Your e-visit answers were reviewed by a board certified advanced clinical practitioner to complete your personal care plan. Depending upon the condition, your plan could have included both over the counter or prescription medications. Please review your pharmacy choice. If there is a problem, you may call our nursing hot line at and have the prescription routed to another pharmacy. Your safety is important to [place]. If you have drug allergies check your prescription carefully.   You can use MyChart to ask questions about today's visit, request a non-urgent call back, or ask for a work or school excuse for 24 hours related to this e-Visit. If it has been greater than 24 hours you will need to follow up with your provider, or enter a new e-Visit to address those concerns. You will get an e-mail in the  next two days asking about your experience.  I hope that your e-visit has been valuable and will speed your recovery. Thank you for using e-visits.     Greater than 5 minutes, yet less than 10 minutes of time have been spent researching, coordinating, and implementing care for this patient today

## 2022-06-11 ENCOUNTER — Other Ambulatory Visit: Payer: Self-pay | Admitting: Sports Medicine

## 2022-06-11 DIAGNOSIS — R29898 Other symptoms and signs involving the musculoskeletal system: Secondary | ICD-10-CM

## 2022-06-11 DIAGNOSIS — G8929 Other chronic pain: Secondary | ICD-10-CM

## 2022-06-11 DIAGNOSIS — S4992XS Unspecified injury of left shoulder and upper arm, sequela: Secondary | ICD-10-CM

## 2022-07-06 ENCOUNTER — Ambulatory Visit
Admission: RE | Admit: 2022-07-06 | Discharge: 2022-07-06 | Disposition: A | Discharge status: Home / Self Care | Payer: BLUE CROSS/BLUE SHIELD | Source: Ambulatory Visit | Attending: Sports Medicine | Admitting: Sports Medicine

## 2022-07-06 DIAGNOSIS — S4992XS Unspecified injury of left shoulder and upper arm, sequela: Secondary | ICD-10-CM

## 2022-07-06 DIAGNOSIS — R29898 Other symptoms and signs involving the musculoskeletal system: Secondary | ICD-10-CM

## 2022-07-06 DIAGNOSIS — G8929 Other chronic pain: Secondary | ICD-10-CM

## 2022-07-06 MED ORDER — IOPAMIDOL (ISOVUE-M 200) INJECTION 41%
18.0000 mL | Freq: Once | INTRAMUSCULAR | Status: AC
  Administered 2022-07-06: 18 mL via INTRA_ARTICULAR

## 2024-06-06 ENCOUNTER — Ambulatory Visit

## 2024-06-06 DIAGNOSIS — Z23 Encounter for immunization: Secondary | ICD-10-CM

## 2024-06-25 ENCOUNTER — Other Ambulatory Visit: Payer: Self-pay | Admitting: Cardiology

## 2024-06-25 DIAGNOSIS — R0789 Other chest pain: Secondary | ICD-10-CM

## 2024-06-25 DIAGNOSIS — I10 Essential (primary) hypertension: Secondary | ICD-10-CM

## 2024-06-25 DIAGNOSIS — Z8249 Family history of ischemic heart disease and other diseases of the circulatory system: Secondary | ICD-10-CM

## 2024-06-25 DIAGNOSIS — R0609 Other forms of dyspnea: Secondary | ICD-10-CM

## 2024-06-25 DIAGNOSIS — I493 Ventricular premature depolarization: Secondary | ICD-10-CM

## 2024-07-09 ENCOUNTER — Other Ambulatory Visit: Payer: Self-pay | Admitting: Cardiology

## 2024-07-09 DIAGNOSIS — R0789 Other chest pain: Secondary | ICD-10-CM

## 2024-07-09 NOTE — Progress Notes (Signed)
 Orders only for Cardiac PET stress test.   Danita Bloch, PA-C

## 2024-07-10 ENCOUNTER — Encounter (HOSPITAL_COMMUNITY): Payer: Self-pay

## 2024-07-12 ENCOUNTER — Other Ambulatory Visit: Payer: Self-pay | Admitting: Cardiology

## 2024-07-12 ENCOUNTER — Ambulatory Visit
Admission: RE | Admit: 2024-07-12 | Discharge: 2024-07-12 | Disposition: A | Discharge status: Home / Self Care | Source: Ambulatory Visit | Attending: Cardiology | Admitting: Cardiology

## 2024-07-12 DIAGNOSIS — I493 Ventricular premature depolarization: Secondary | ICD-10-CM | POA: Diagnosis present

## 2024-07-12 DIAGNOSIS — Z6841 Body Mass Index (BMI) 40.0 and over, adult: Secondary | ICD-10-CM | POA: Insufficient documentation

## 2024-07-12 DIAGNOSIS — R0609 Other forms of dyspnea: Secondary | ICD-10-CM | POA: Insufficient documentation

## 2024-07-12 DIAGNOSIS — R0789 Other chest pain: Secondary | ICD-10-CM | POA: Insufficient documentation

## 2024-07-12 DIAGNOSIS — I10 Essential (primary) hypertension: Secondary | ICD-10-CM

## 2024-07-12 DIAGNOSIS — Z8249 Family history of ischemic heart disease and other diseases of the circulatory system: Secondary | ICD-10-CM

## 2024-07-12 MED ORDER — RUBIDIUM RB82 GENERATOR (RUBYFILL)
25.0000 | PACK | Freq: Once | INTRAVENOUS | Status: AC
  Administered 2024-07-12: 24.97 via INTRAVENOUS

## 2024-07-12 MED ORDER — RUBIDIUM RB82 GENERATOR (RUBYFILL)
3.1500 | PACK | Freq: Once | INTRAVENOUS | Status: AC
  Administered 2024-07-12: 3.15 via INTRAVENOUS

## 2024-07-12 MED ORDER — REGADENOSON 0.4 MG/5ML IV SOLN
INTRAVENOUS | Status: AC
  Filled 2024-07-12: qty 5

## 2024-07-12 MED ORDER — REGADENOSON 0.4 MG/5ML IV SOLN
0.4000 mg | Freq: Once | INTRAVENOUS | Status: AC
  Administered 2024-07-12: 0.4 mg via INTRAVENOUS
  Filled 2024-07-12: qty 5

## 2024-07-13 LAB — NM PET CT CARDIAC PERFUSION MULTI W/ABSOLUTE BLOODFLOW
LV dias vol: 215 mL (ref 62–150)
LV sys vol: 96 mL (ref 4.2–5.8)
MBFR: 1.89
Nuc Rest EF: 50 %
Nuc Stress EF: 55 %
Peak HR: 60 {beats}/min
Rest HR: 55 {beats}/min
Rest MBF: 0.45 ml/g/min
Rest Nuclear Isotope Dose: 25 mCi
SRS: 0
SSS: 0
ST Depression (mm): 0 mm
Stress MBF: 0.85 ml/g/min
Stress Nuclear Isotope Dose: 25 mCi
TID: 1.12
